# Patient Record
Sex: Female | Born: 1975 | Race: Black or African American | Hispanic: No | Marital: Single | Smoking: Never smoker
Health system: Southern US, Community
[De-identification: ages and names within clinical notes are randomized; demographics above are authoritative.]

## PROBLEM LIST (undated history)

## (undated) DIAGNOSIS — I10 Essential (primary) hypertension: Secondary | ICD-10-CM

## (undated) DIAGNOSIS — J45909 Unspecified asthma, uncomplicated: Secondary | ICD-10-CM

## (undated) HISTORY — PX: OTHER SURGICAL HISTORY: SHX169

## (undated) HISTORY — PX: ABDOMINAL SURGERY: SHX537

## (undated) HISTORY — PX: HEEL SPUR SURGERY: SHX665

## (undated) HISTORY — PX: CARPAL TUNNEL RELEASE: SHX101

## (undated) HISTORY — PX: CHOLECYSTECTOMY: SHX55

---

## 2020-06-09 ENCOUNTER — Encounter (HOSPITAL_COMMUNITY): Payer: Self-pay

## 2020-06-09 ENCOUNTER — Emergency Department (HOSPITAL_COMMUNITY): Payer: Medicare Other

## 2020-06-09 ENCOUNTER — Emergency Department (HOSPITAL_COMMUNITY)
Admission: EM | Admit: 2020-06-09 | Discharge: 2020-06-10 | Disposition: A | Discharge status: Home / Self Care | Payer: Medicare Other | Attending: Emergency Medicine | Admitting: Emergency Medicine

## 2020-06-09 DIAGNOSIS — R1012 Left upper quadrant pain: Secondary | ICD-10-CM | POA: Insufficient documentation

## 2020-06-09 DIAGNOSIS — Z5321 Procedure and treatment not carried out due to patient leaving prior to being seen by health care provider: Secondary | ICD-10-CM | POA: Diagnosis not present

## 2020-06-09 DIAGNOSIS — R0602 Shortness of breath: Secondary | ICD-10-CM | POA: Insufficient documentation

## 2020-06-09 DIAGNOSIS — R110 Nausea: Secondary | ICD-10-CM | POA: Diagnosis not present

## 2020-06-09 HISTORY — DX: Essential (primary) hypertension: I10

## 2020-06-09 HISTORY — DX: Unspecified asthma, uncomplicated: J45.909

## 2020-06-09 LAB — COMPREHENSIVE METABOLIC PANEL
ALT: 10 U/L (ref 0–44)
AST: 14 U/L — ABNORMAL LOW (ref 15–41)
Albumin: 3.4 g/dL — ABNORMAL LOW (ref 3.5–5.0)
Alkaline Phosphatase: 48 U/L (ref 38–126)
Anion gap: 9 (ref 5–15)
BUN: 8 mg/dL (ref 6–20)
CO2: 29 mmol/L (ref 22–32)
Calcium: 9.1 mg/dL (ref 8.9–10.3)
Chloride: 101 mmol/L (ref 98–111)
Creatinine, Ser: 0.62 mg/dL (ref 0.44–1.00)
GFR calc Af Amer: >60 mL/min (ref >60)
GFR calc non Af Amer: >60 mL/min (ref >60)
Glucose, Bld: 98 mg/dL (ref 70–99)
Potassium: 3.7 mmol/L (ref 3.5–5.1)
Sodium: 139 mmol/L (ref 135–145)
Total Bilirubin: 0.3 mg/dL (ref 0.3–1.2)
Total Protein: 7.5 g/dL (ref 6.5–8.1)

## 2020-06-09 LAB — CBC
HCT: 32.6 % — ABNORMAL LOW (ref 36.0–46.0)
Hemoglobin: 9.7 g/dL — ABNORMAL LOW (ref 12.0–15.0)
MCH: 27.1 pg (ref 26.0–34.0)
MCHC: 29.8 g/dL — ABNORMAL LOW (ref 30.0–36.0)
MCV: 91.1 fL (ref 80.0–100.0)
Platelets: 392 10*3/uL (ref 150–400)
RBC: 3.58 MIL/uL — ABNORMAL LOW (ref 3.87–5.11)
RDW: 14.1 % (ref 11.5–15.5)
WBC: 7 10*3/uL (ref 4.0–10.5)
nRBC: 0 % (ref 0.0–0.2)

## 2020-06-09 LAB — I-STAT BETA HCG BLOOD, ED (MC, WL, AP ONLY): I-stat hCG, quantitative: <5 m[IU]/mL (ref <5)

## 2020-06-09 LAB — LIPASE, BLOOD: Lipase: 21 U/L (ref 11–51)

## 2020-06-09 MED ORDER — SODIUM CHLORIDE 0.9% FLUSH: 3.0000 mL | Freq: Once | INTRAVENOUS | Status: DC

## 2020-06-09 NOTE — ED Triage Notes (Signed)
Pt presents with c/o left upper quadrant abdominal pain for 4 days. Pt also c/o some shortness of breath. Pt reports she has been unable to eat and is nauseated.

## 2022-03-06 IMAGING — CR DG CHEST 2V
2 series · 2 of 2 positions shown · non-contrast
Comparison: No priors.

CLINICAL DATA: 44-year-old female with history of shortness of
breath with left upper quadrant abdominal pain.

EXAM:
CHEST - 2 VIEW

[w chest pa]
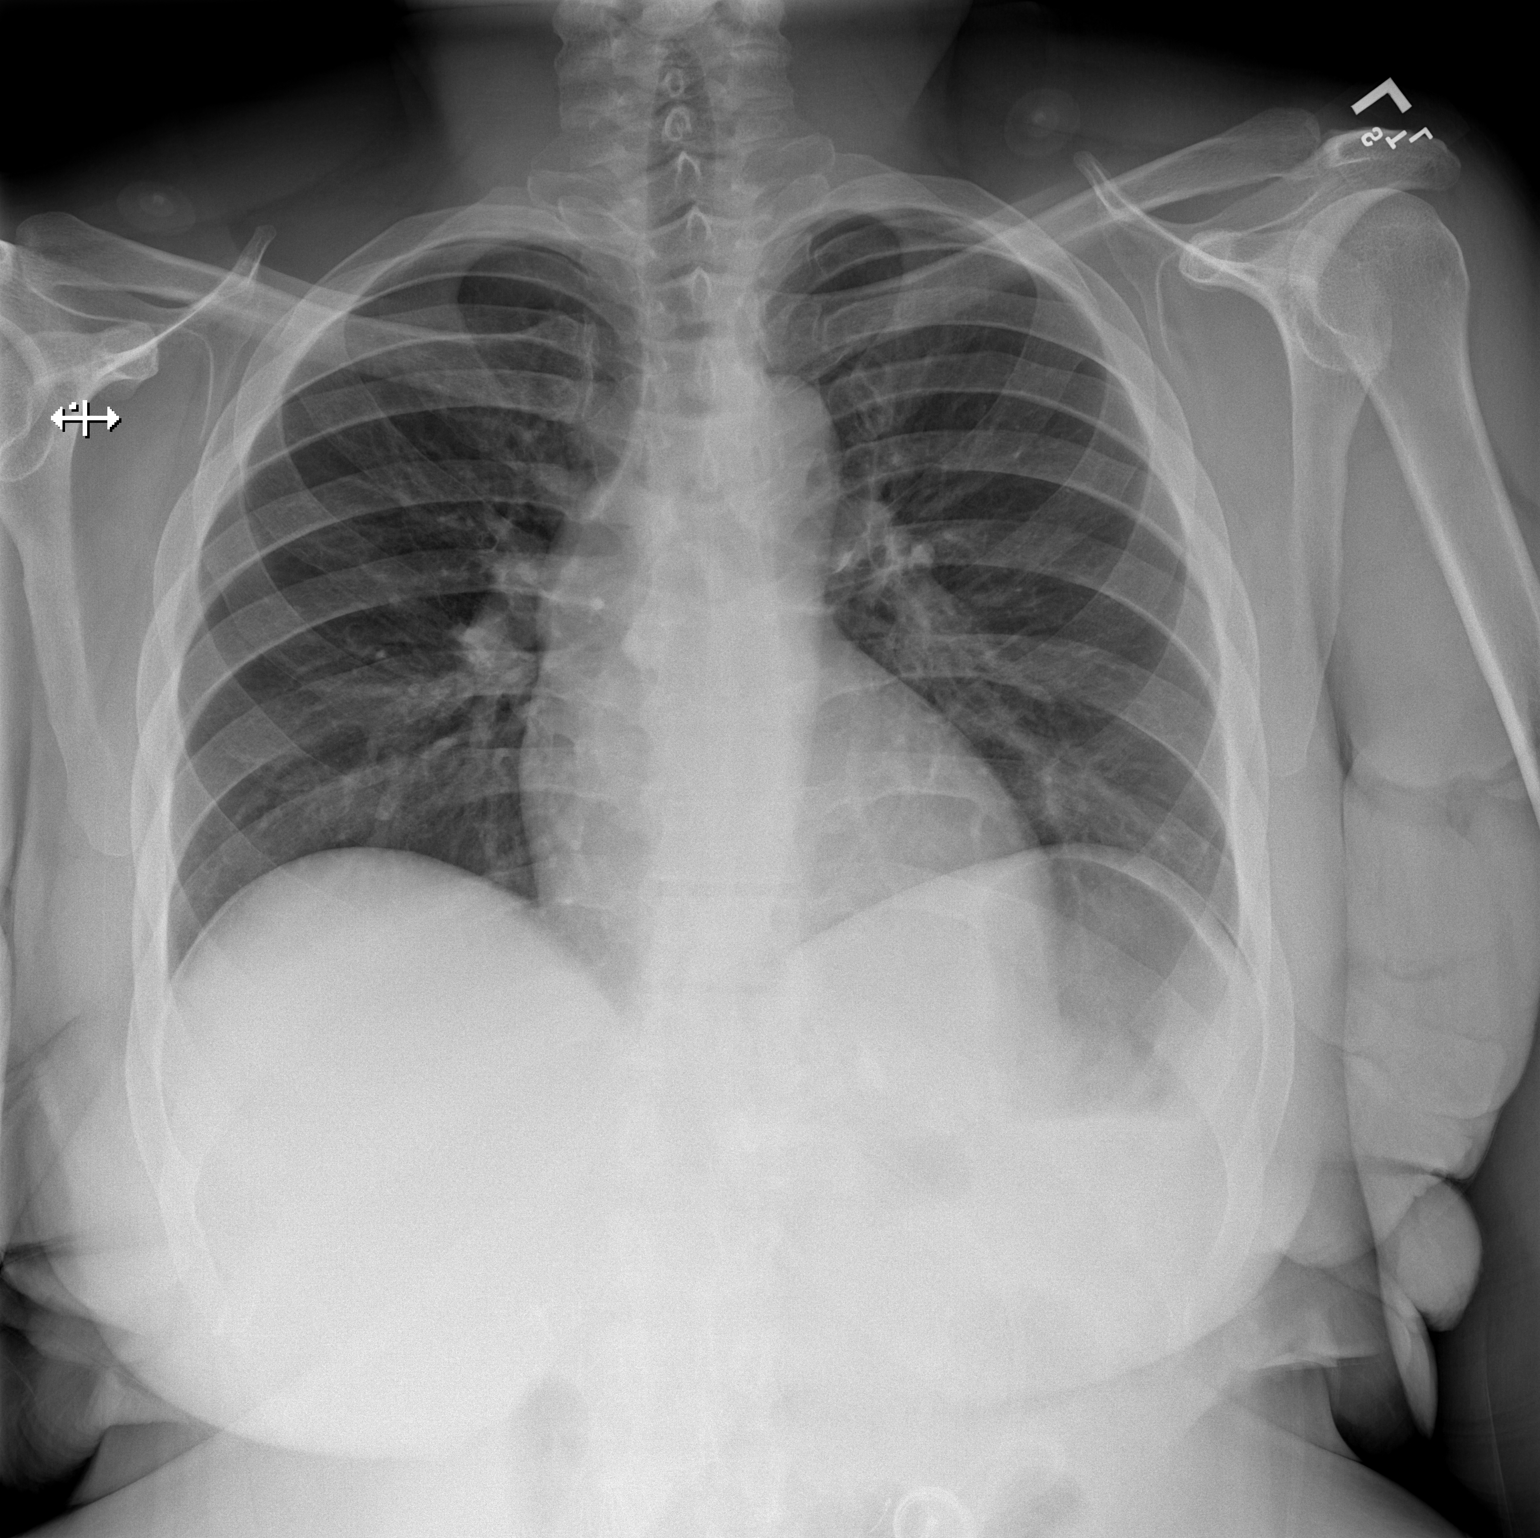

[w chest lat]
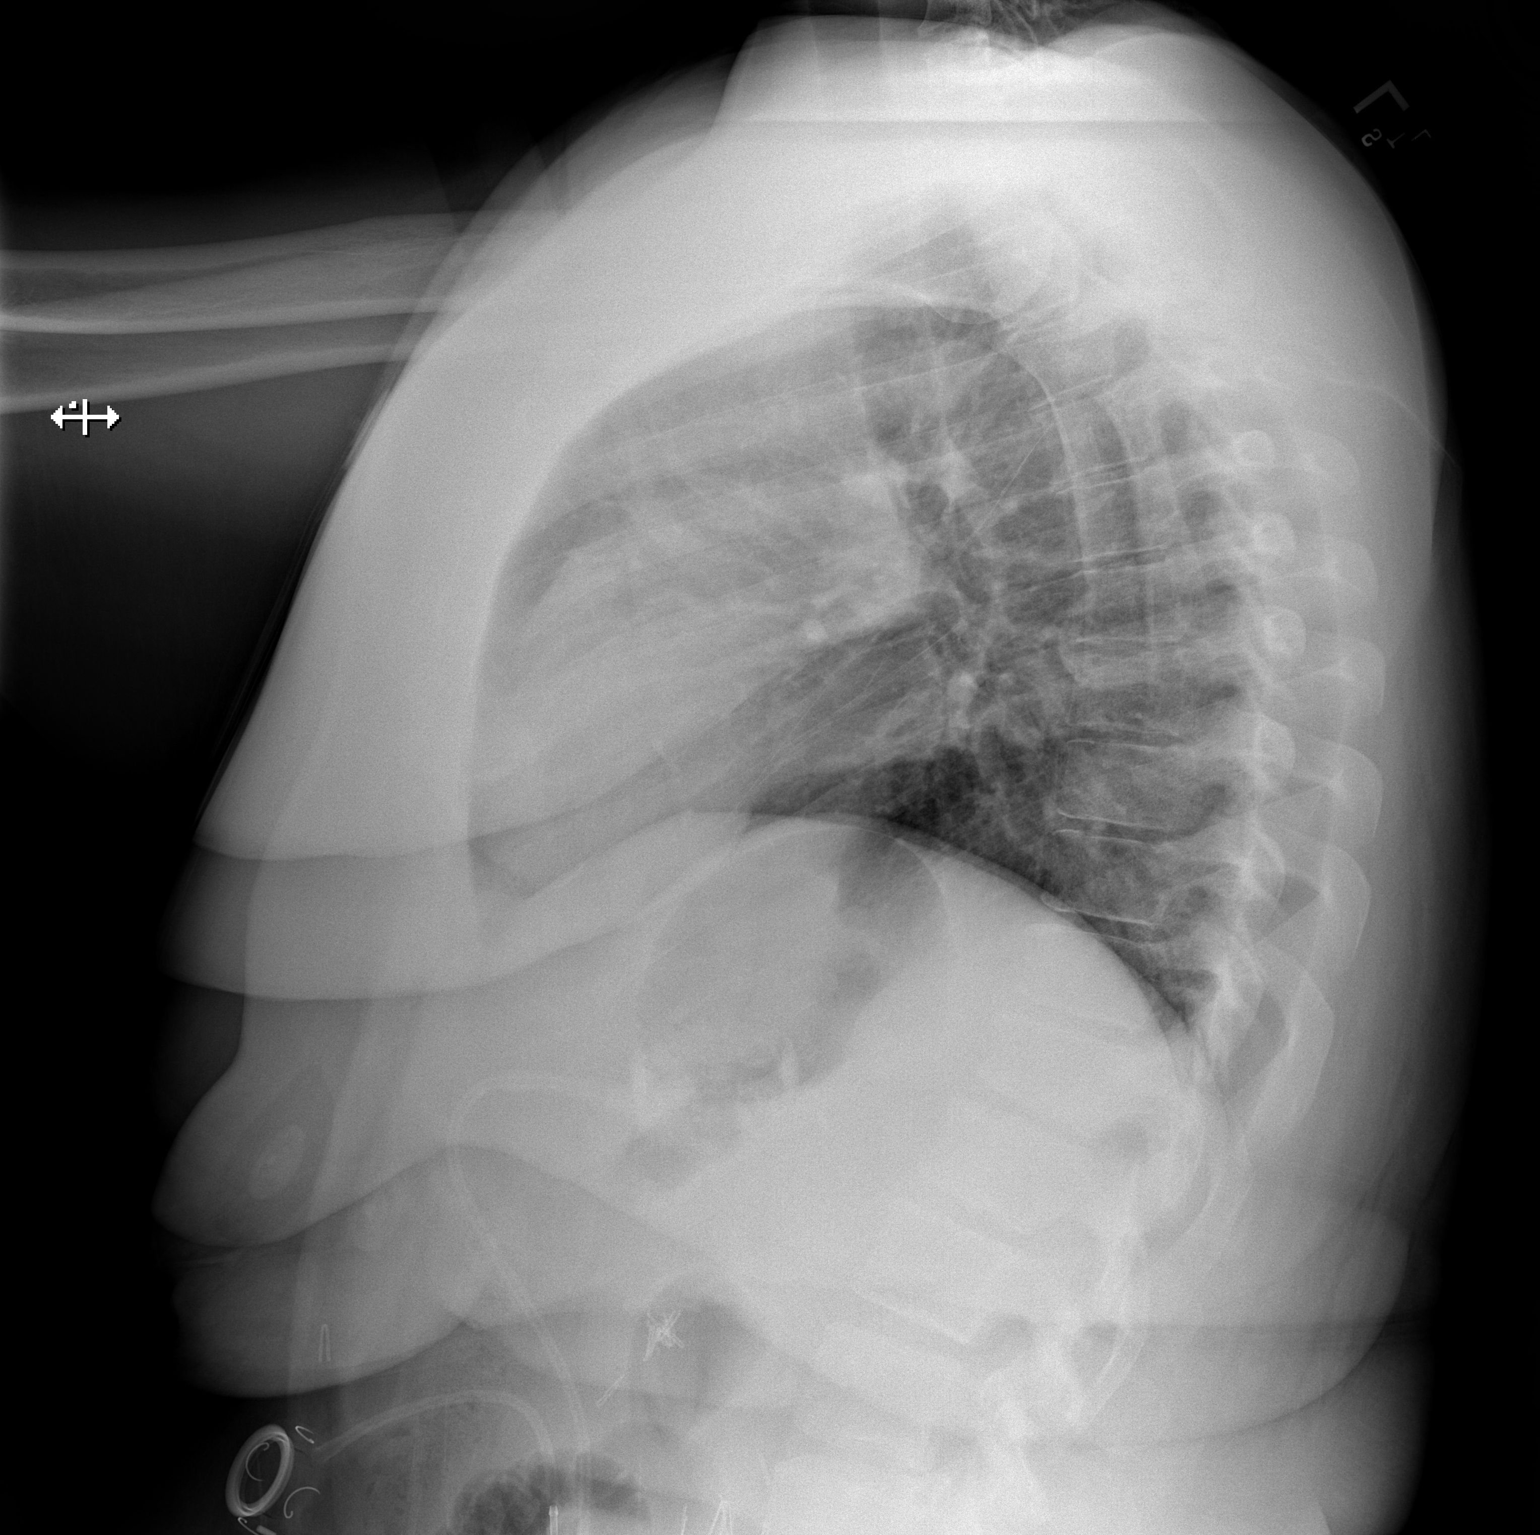

[2 of 2 positions shown; findings below may reference images not displayed]

FINDINGS: Lung volumes are normal. No consolidative airspace disease. No
pleural effusions. No pneumothorax. No pulmonary nodule or mass
noted. Pulmonary vasculature and the cardiomediastinal silhouette
are within normal limits.
IMPRESSION: No radiographic evidence of acute cardiopulmonary disease.
